# Patient Record
Sex: Male | Born: 1980 | Race: White | Hispanic: No | Marital: Single | Smoking: Current every day smoker
Health system: Southern US, Community
[De-identification: ages and names within clinical notes are randomized; demographics above are authoritative.]

## PROBLEM LIST (undated history)

## (undated) HISTORY — PX: HEMORRHOID SURGERY: SHX153

---

## 2005-05-23 ENCOUNTER — Emergency Department (HOSPITAL_COMMUNITY): Admission: EM | Admit: 2005-05-23 | Discharge: 2005-05-23 | Payer: Self-pay | Admitting: Emergency Medicine

## 2016-04-25 ENCOUNTER — Emergency Department (HOSPITAL_COMMUNITY): Payer: Self-pay

## 2016-04-25 ENCOUNTER — Encounter (HOSPITAL_COMMUNITY): Payer: Self-pay | Admitting: *Deleted

## 2016-04-25 ENCOUNTER — Emergency Department (HOSPITAL_COMMUNITY)
Admission: EM | Admit: 2016-04-25 | Discharge: 2016-04-25 | Disposition: A | Discharge status: Home / Self Care | Payer: Self-pay | Attending: Emergency Medicine | Admitting: Emergency Medicine

## 2016-04-25 DIAGNOSIS — F17210 Nicotine dependence, cigarettes, uncomplicated: Secondary | ICD-10-CM | POA: Insufficient documentation

## 2016-04-25 DIAGNOSIS — S0083XA Contusion of other part of head, initial encounter: Secondary | ICD-10-CM | POA: Insufficient documentation

## 2016-04-25 DIAGNOSIS — Y999 Unspecified external cause status: Secondary | ICD-10-CM | POA: Insufficient documentation

## 2016-04-25 DIAGNOSIS — R0781 Pleurodynia: Secondary | ICD-10-CM | POA: Insufficient documentation

## 2016-04-25 DIAGNOSIS — Y9289 Other specified places as the place of occurrence of the external cause: Secondary | ICD-10-CM | POA: Insufficient documentation

## 2016-04-25 DIAGNOSIS — R1012 Left upper quadrant pain: Secondary | ICD-10-CM | POA: Insufficient documentation

## 2016-04-25 DIAGNOSIS — R1011 Right upper quadrant pain: Secondary | ICD-10-CM | POA: Insufficient documentation

## 2016-04-25 DIAGNOSIS — Y9389 Activity, other specified: Secondary | ICD-10-CM | POA: Insufficient documentation

## 2016-04-25 LAB — I-STAT CREATININE, ED: Creatinine, Ser: 0.8 mg/dL (ref 0.61–1.24)

## 2016-04-25 MED ORDER — IOPAMIDOL (ISOVUE-300) INJECTION 61%
INTRAVENOUS | Status: AC
  Administered 2016-04-25: 100 mL
  Filled 2016-04-25: qty 100

## 2016-04-25 MED ORDER — IBUPROFEN 800 MG PO TABS: 800.0000 mg | ORAL_TABLET | Freq: Three times a day (TID) | ORAL | 0 refills | Status: AC

## 2016-04-25 MED ORDER — HYDROCODONE-ACETAMINOPHEN 5-325 MG PO TABS
1.0000 | ORAL_TABLET | Freq: Once | ORAL | Status: AC
  Administered 2016-04-25: 1 via ORAL
  Filled 2016-04-25: qty 1

## 2016-04-25 NOTE — ED Notes (Signed)
Pt returned from CT °
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
## 2016-05-01 ENCOUNTER — Encounter (HOSPITAL_COMMUNITY): Payer: Self-pay | Admitting: Emergency Medicine

## 2016-05-01 ENCOUNTER — Emergency Department (HOSPITAL_COMMUNITY)
Admission: EM | Admit: 2016-05-01 | Discharge: 2016-05-01 | Disposition: A | Discharge status: Home / Self Care | Payer: Self-pay | Attending: Emergency Medicine | Admitting: Emergency Medicine

## 2016-05-01 DIAGNOSIS — R0781 Pleurodynia: Secondary | ICD-10-CM | POA: Insufficient documentation

## 2016-05-01 DIAGNOSIS — Z79899 Other long term (current) drug therapy: Secondary | ICD-10-CM | POA: Insufficient documentation

## 2016-05-01 DIAGNOSIS — F17210 Nicotine dependence, cigarettes, uncomplicated: Secondary | ICD-10-CM | POA: Insufficient documentation

## 2016-05-01 DIAGNOSIS — I10 Essential (primary) hypertension: Secondary | ICD-10-CM | POA: Insufficient documentation

## 2016-05-01 NOTE — Discharge Instructions (Signed)
Get help right away if:  You develop a severe headache or confusion.  You have unusual weakness or numbness.  You feel faint.  You have severe pain in your chest or abdomen.  You vomit repeatedly.  You have trouble breathing.
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
## 2016-05-08 ENCOUNTER — Emergency Department (HOSPITAL_COMMUNITY)
Admission: EM | Admit: 2016-05-08 | Discharge: 2016-05-08 | Disposition: A | Discharge status: Home / Self Care | Payer: Self-pay | Attending: Emergency Medicine | Admitting: Emergency Medicine

## 2016-05-08 ENCOUNTER — Encounter (HOSPITAL_COMMUNITY): Payer: Self-pay | Admitting: Emergency Medicine

## 2016-05-08 DIAGNOSIS — Y939 Activity, unspecified: Secondary | ICD-10-CM | POA: Insufficient documentation

## 2016-05-08 DIAGNOSIS — S20212A Contusion of left front wall of thorax, initial encounter: Secondary | ICD-10-CM | POA: Insufficient documentation

## 2016-05-08 DIAGNOSIS — Z79899 Other long term (current) drug therapy: Secondary | ICD-10-CM | POA: Insufficient documentation

## 2016-05-08 DIAGNOSIS — F17210 Nicotine dependence, cigarettes, uncomplicated: Secondary | ICD-10-CM | POA: Insufficient documentation

## 2016-05-08 DIAGNOSIS — Y999 Unspecified external cause status: Secondary | ICD-10-CM | POA: Insufficient documentation

## 2016-05-08 DIAGNOSIS — Y929 Unspecified place or not applicable: Secondary | ICD-10-CM | POA: Insufficient documentation

## 2016-05-08 NOTE — ED Notes (Signed)
Discharge instructions reviewed with patient. Patient verbalized understanding. 
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
## 2016-05-08 NOTE — Discharge Instructions (Signed)
Please follow-up with primary care for reassessment and help with FMLA paperwork.

## 2016-05-08 NOTE — ED Notes (Signed)
ED Provider at bedside. 
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
